# Patient Record
Sex: Male | Born: 1985 | Race: White | Hispanic: No | Marital: Married | Smoking: Current some day smoker
Health system: Southern US, Community
[De-identification: ages and names within clinical notes are randomized; demographics above are authoritative.]

---

## 2003-09-13 ENCOUNTER — Encounter: Payer: Self-pay | Admitting: Emergency Medicine

## 2003-09-13 ENCOUNTER — Emergency Department (HOSPITAL_COMMUNITY): Admission: EM | Admit: 2003-09-13 | Discharge: 2003-09-13 | Payer: Self-pay | Admitting: Emergency Medicine

## 2012-06-27 ENCOUNTER — Ambulatory Visit (INDEPENDENT_AMBULATORY_CARE_PROVIDER_SITE_OTHER): Payer: Commercial Indemnity | Admitting: Physician Assistant

## 2012-06-27 VITALS — BP 139/90 | HR 83 | Temp 98.4°F | Resp 18 | Ht 69.0 in | Wt 292.0 lb

## 2012-06-27 DIAGNOSIS — H902 Conductive hearing loss, unspecified: Secondary | ICD-10-CM

## 2012-06-27 DIAGNOSIS — H6120 Impacted cerumen, unspecified ear: Secondary | ICD-10-CM

## 2012-06-27 NOTE — Progress Notes (Signed)
  Subjective:    Patient ID: James Fry, male    DOB: December 02, 1986, 26 y.o.   MRN: [id number]  HPI Patient presents with possible cerumen impaction. Has noticed decreased hearing over the last week or so, but last night hearing in his left ear became even more noticeably muffled. Denies otalgia, nasal congestion, sore throat, cough, fevers/chills, or headache. No history of cerumen impaction. Does admit to Q-tip use this week to try and and clean out his ears.     Review of Systems  All other systems reviewed and are negative.       Objective:   Physical Exam  Constitutional: He is oriented to person, place, and time. He appears well-developed and well-nourished.  HENT:  Head: Normocephalic and atraumatic.  Right Ear: External ear normal.  Left Ear: External ear normal.  Nose: Nose normal.       Bilateral cerumen impaction.  Normal canal and TM visualized s/p ear irrigation  Eyes: Conjunctivae are normal.  Neck: Normal range of motion.  Cardiovascular: Normal rate, regular rhythm and normal heart sounds.   Pulmonary/Chest: Effort normal and breath sounds normal.  Lymphadenopathy:    He has no cervical adenopathy.  Neurological: He is alert and oriented to person, place, and time.  Psychiatric: He has a normal mood and affect. His behavior is normal. Judgment and thought content normal.          Assessment & Plan:   1. Cerumen impaction  Ear care instructions provided. Follow up as needed.   2. Conductive hearing loss

## 2012-06-27 NOTE — Patient Instructions (Addendum)
Discontinue use of Q-tips, hair pins, keys, etc.   To help prevent cerumen impaction: While in the washing hair in the shower, allow soapy water to run into ear canal for a few minutes and then rinse (it dissolves the wax like a dishwasher dissolves grease).   May also use half hydrogen peroxide half water in the shower 2-3 times per week to help rinse out the wax. DO NOT USE 100% HYDROGEN PEROXIDE (this can burn the skin).   Also, several drops of sweet oil can be used to help prevent itching of the canal. If this is not enough to decrease itch, you may put a small amount of OTC hydrocortisone cream on pinky finger and apply to portion of canal that can be reached with tip of finger.  Recommend OTC Debrox or Colace to prevent impaction.  

## 2018-07-08 ENCOUNTER — Emergency Department (HOSPITAL_COMMUNITY)
Admission: EM | Admit: 2018-07-08 | Discharge: 2018-07-08 | Disposition: A | Discharge status: Home / Self Care | Payer: BLUE CROSS/BLUE SHIELD | Attending: Emergency Medicine | Admitting: Emergency Medicine

## 2018-07-08 ENCOUNTER — Emergency Department (HOSPITAL_COMMUNITY): Payer: BLUE CROSS/BLUE SHIELD

## 2018-07-08 ENCOUNTER — Other Ambulatory Visit: Payer: Self-pay

## 2018-07-08 ENCOUNTER — Encounter (HOSPITAL_COMMUNITY): Payer: Self-pay | Admitting: Emergency Medicine

## 2018-07-08 DIAGNOSIS — R112 Nausea with vomiting, unspecified: Secondary | ICD-10-CM | POA: Diagnosis present

## 2018-07-08 DIAGNOSIS — R1011 Right upper quadrant pain: Secondary | ICD-10-CM | POA: Insufficient documentation

## 2018-07-08 DIAGNOSIS — F17290 Nicotine dependence, other tobacco product, uncomplicated: Secondary | ICD-10-CM | POA: Insufficient documentation

## 2018-07-08 DIAGNOSIS — R109 Unspecified abdominal pain: Secondary | ICD-10-CM

## 2018-07-08 DIAGNOSIS — Z79899 Other long term (current) drug therapy: Secondary | ICD-10-CM | POA: Insufficient documentation

## 2018-07-08 LAB — COMPREHENSIVE METABOLIC PANEL
ALBUMIN: 4.1 g/dL (ref 3.5–5.0)
ALT: 55 U/L — ABNORMAL HIGH (ref 0–44)
AST: 29 U/L (ref 15–41)
Alkaline Phosphatase: 64 U/L (ref 38–126)
Anion gap: 14 (ref 5–15)
BUN: 12 mg/dL (ref 6–20)
CHLORIDE: 107 mmol/L (ref 98–111)
CO2: 22 mmol/L (ref 22–32)
Calcium: 9.7 mg/dL (ref 8.9–10.3)
Creatinine, Ser: 1.26 mg/dL — ABNORMAL HIGH (ref 0.61–1.24)
GFR calc Af Amer: >60 mL/min (ref >60)
GFR calc non Af Amer: >60 mL/min (ref >60)
GLUCOSE: 135 mg/dL — AB (ref 70–99)
POTASSIUM: 3.9 mmol/L (ref 3.5–5.1)
Sodium: 143 mmol/L (ref 135–145)
Total Bilirubin: 0.5 mg/dL (ref 0.3–1.2)
Total Protein: 7.4 g/dL (ref 6.5–8.1)

## 2018-07-08 LAB — I-STAT CG4 LACTIC ACID, ED
Lactic Acid, Venous: 2.27 mmol/L (ref 0.5–1.9)
Lactic Acid, Venous: 2.01 mmol/L (ref 0.5–1.9)

## 2018-07-08 LAB — URINALYSIS, ROUTINE W REFLEX MICROSCOPIC
Bilirubin Urine: NEGATIVE
GLUCOSE, UA: NEGATIVE mg/dL
Hgb urine dipstick: NEGATIVE
KETONES UR: NEGATIVE mg/dL
LEUKOCYTES UA: NEGATIVE
Nitrite: NEGATIVE
PH: 5 (ref 5.0–8.0)
Protein, ur: NEGATIVE mg/dL
Specific Gravity, Urine: 1.028 (ref 1.005–1.030)

## 2018-07-08 LAB — CBC
HEMATOCRIT: 47.3 % (ref 39.0–52.0)
Hemoglobin: 15.6 g/dL (ref 13.0–17.0)
MCH: 28 pg (ref 26.0–34.0)
MCHC: 33 g/dL (ref 30.0–36.0)
MCV: 84.9 fL (ref 78.0–100.0)
Platelets: 278 10*3/uL (ref 150–400)
RBC: 5.57 MIL/uL (ref 4.22–5.81)
RDW: 12.8 % (ref 11.5–15.5)
WBC: 12 10*3/uL — ABNORMAL HIGH (ref 4.0–10.5)

## 2018-07-08 LAB — RAPID URINE DRUG SCREEN, HOSP PERFORMED
AMPHETAMINES: NOT DETECTED
Benzodiazepines: NOT DETECTED
Cocaine: NOT DETECTED
OPIATES: NOT DETECTED
Tetrahydrocannabinol: NOT DETECTED

## 2018-07-08 LAB — LIPASE, BLOOD: LIPASE: 33 U/L (ref 11–51)

## 2018-07-08 LAB — I-STAT TROPONIN, ED: TROPONIN I, POC: 0 ng/mL (ref 0.00–0.08)

## 2018-07-08 MED ORDER — HALOPERIDOL LACTATE 5 MG/ML IJ SOLN: 5.0000 mg | Freq: Once | INTRAMUSCULAR | Status: DC

## 2018-07-08 MED ORDER — ONDANSETRON HCL 4 MG/2ML IJ SOLN
4.0000 mg | Freq: Once | INTRAMUSCULAR | Status: AC
  Administered 2018-07-08: 4 mg via INTRAVENOUS
  Filled 2018-07-08: qty 2

## 2018-07-08 MED ORDER — ONDANSETRON 8 MG PO TBDP: ORAL_TABLET | ORAL | 0 refills | Status: AC

## 2018-07-08 MED ORDER — KETOROLAC TROMETHAMINE 30 MG/ML IJ SOLN
30.0000 mg | Freq: Once | INTRAMUSCULAR | Status: AC
  Administered 2018-07-08: 30 mg via INTRAVENOUS

## 2018-07-08 MED ORDER — KETOROLAC TROMETHAMINE 30 MG/ML IJ SOLN
INTRAMUSCULAR | Status: AC
  Administered 2018-07-08: 30 mg via INTRAVENOUS
  Filled 2018-07-08: qty 1

## 2018-07-08 MED ORDER — DICYCLOMINE HCL 20 MG PO TABS: 20.0000 mg | ORAL_TABLET | Freq: Two times a day (BID) | ORAL | 0 refills | Status: AC

## 2018-07-08 MED ORDER — SODIUM CHLORIDE 0.9 % IV BOLUS
1000.0000 mL | Freq: Once | INTRAVENOUS | Status: AC
  Administered 2018-07-08: 1000 mL via INTRAVENOUS

## 2018-07-08 MED ORDER — FENTANYL CITRATE (PF) 100 MCG/2ML IJ SOLN
100.0000 ug | Freq: Once | INTRAMUSCULAR | Status: AC
  Administered 2018-07-08: 100 ug via INTRAVENOUS
  Filled 2018-07-08: qty 2

## 2018-07-08 MED ORDER — DICYCLOMINE HCL 10 MG/ML IM SOLN
20.0000 mg | Freq: Once | INTRAMUSCULAR | Status: AC
  Administered 2018-07-08: 20 mg via INTRAMUSCULAR
  Filled 2018-07-08: qty 2

## 2018-07-08 MED ORDER — OMEPRAZOLE 20 MG PO CPDR: 20.0000 mg | DELAYED_RELEASE_CAPSULE | Freq: Every day | ORAL | 0 refills | Status: AC

## 2018-07-08 NOTE — ED Notes (Signed)
Pt back from US

## 2018-07-08 NOTE — ED Provider Notes (Signed)
[facility] EMERGENCY DEPARTMENT Provider Note   CSN: [id number] Arrival date & time: 07/08/18  0153     History   Chief Complaint Chief Complaint  Patient presents with  . Abdominal Pain  . Emesis    HPI James Fry is a 32 y.o. male.  The history is provided by the patient. No language interpreter was used.  Abdominal Pain   This is a new problem. The current episode started 3 to 5 hours ago. The problem occurs constantly. The problem has not changed since onset.The pain is associated with an unknown factor. The pain is located in the RUQ and RLQ. The pain is at a severity of 10/10. The pain is severe. Associated symptoms include nausea and vomiting. Pertinent negatives include anorexia, belching, diarrhea, flatus, hematochezia, melena, constipation, dysuria, frequency, hematuria and headaches. Nothing aggravates the symptoms. Nothing relieves the symptoms. Past workup does not include surgery. His past medical history does not include PUD or ulcerative colitis.  Pain starting below the costal margin to the pelvis.  Last ate frozen pizza at 7 pm which has never bothered him before.  This pain started after midnight.  No f/c/r.  Has been retching.  No diarrhea no constipation.    History reviewed. No pertinent past medical history.  There are no active problems to display for this patient.   History reviewed. No pertinent surgical history.      Home Medications    Prior to Admission medications   Medication Sig Start Date End Date Taking? Authorizing Provider  fenofibrate (TRICOR) 145 MG tablet Take 145 mg by mouth daily.   Yes [provider]    Family History No family history on file.  Social History Social History   Tobacco Use  . Smoking status: Current Some Day Smoker    Types: Cigars  . Smokeless tobacco: Never Used  Substance Use Topics  . Alcohol use: Not on file  . Drug use: Not on file     Allergies   Penicillins and  Amoxicillin   Review of Systems Review of Systems  Constitutional: Negative for diaphoresis.  HENT: Negative for congestion.   Respiratory: Negative for cough, choking, chest tightness and shortness of breath.   Cardiovascular: Negative for chest pain, palpitations and leg swelling.  Gastrointestinal: Positive for abdominal pain, nausea and vomiting. Negative for anorexia, constipation, diarrhea, flatus, hematochezia and melena.  Genitourinary: Negative for dysuria, frequency and hematuria.  Musculoskeletal: Negative for back pain.  Neurological: Negative for headaches.  Psychiatric/Behavioral: Negative for agitation.  All other systems reviewed and are negative.    Physical Exam Updated Vital Signs BP (!) 150/85   Pulse 84   Temp 97.6 F (36.4 C) (Oral)   Resp (!) 26   Ht 5\' 8"  (1.727 m)   Wt (!) 142.9 kg (315 lb)   SpO2 (!) 86%   BMI 47.90 kg/m   Physical Exam  Constitutional: He is oriented to person, place, and time. He appears well-developed and well-nourished. No distress.  HENT:  Head: Normocephalic and atraumatic.  Mouth/Throat: No oropharyngeal exudate.  Eyes: Pupils are equal, round, and reactive to light. Conjunctivae are normal.  Neck: Normal range of motion. Neck supple.  Cardiovascular: Normal rate, regular rhythm, normal heart sounds and intact distal pulses.  Pulmonary/Chest: Effort normal and breath sounds normal. No stridor. He has no wheezes. He has no rales.  Abdominal: Soft. He exhibits no mass. Bowel sounds are increased. There is no hepatosplenomegaly. There is no rebound, no  guarding, no tenderness at McBurney's point and negative Murphy's sign. No hernia.  Musculoskeletal: Normal range of motion. He exhibits no tenderness.  Neurological: He is alert and oriented to person, place, and time. He displays normal reflexes.  Skin: Skin is warm and dry. Capillary refill takes less than 2 seconds.  Psychiatric: He has a normal mood and affect.     ED  Treatments / Results  Labs (all labs ordered are listed, but only abnormal results are displayed) Results for orders placed or performed during the hospital encounter of 07/08/18  Lipase, blood  Result Value Ref Range   Lipase 33 11 - 51 U/L  Comprehensive metabolic panel  Result Value Ref Range   Sodium 143 135 - 145 mmol/L   Potassium 3.9 3.5 - 5.1 mmol/L   Chloride 107 98 - 111 mmol/L   CO2 22 22 - 32 mmol/L   Glucose, Bld 135 (H) 70 - 99 mg/dL   BUN 12 6 - 20 mg/dL   Creatinine, Ser 1.61 (H) 0.61 - 1.24 mg/dL   Calcium 9.7 8.9 - 09.6 mg/dL   Total Protein 7.4 6.5 - 8.1 g/dL   Albumin 4.1 3.5 - 5.0 g/dL   AST 29 15 - 41 U/L   ALT 55 (H) 0 - 44 U/L   Alkaline Phosphatase 64 38 - 126 U/L   Total Bilirubin 0.5 0.3 - 1.2 mg/dL   GFR calc non Af Amer >60 >60 mL/min   GFR calc Af Amer >60 >60 mL/min   Anion gap 14 5 - 15  CBC  Result Value Ref Range   WBC 12.0 (H) 4.0 - 10.5 K/uL   RBC 5.57 4.22 - 5.81 MIL/uL   Hemoglobin 15.6 13.0 - 17.0 g/dL   HCT 04.5 40.9 - 81.1 %   MCV 84.9 78.0 - 100.0 fL   MCH 28.0 26.0 - 34.0 pg   MCHC 33.0 30.0 - 36.0 g/dL   RDW 91.4 78.2 - 95.6 %   Platelets 278 150 - 400 K/uL  Urinalysis, Routine w reflex microscopic  Result Value Ref Range   Color, Urine YELLOW YELLOW   APPearance CLEAR CLEAR   Specific Gravity, Urine 1.028 1.005 - 1.030   pH 5.0 5.0 - 8.0   Glucose, UA NEGATIVE NEGATIVE mg/dL   Hgb urine dipstick NEGATIVE NEGATIVE   Bilirubin Urine NEGATIVE NEGATIVE   Ketones, ur NEGATIVE NEGATIVE mg/dL   Protein, ur NEGATIVE NEGATIVE mg/dL   Nitrite NEGATIVE NEGATIVE   Leukocytes, UA NEGATIVE NEGATIVE  Rapid urine drug screen (hospital performed)  Result Value Ref Range   Opiates NONE DETECTED NONE DETECTED   Cocaine NONE DETECTED NONE DETECTED   Benzodiazepines NONE DETECTED NONE DETECTED   Amphetamines NONE DETECTED NONE DETECTED   Tetrahydrocannabinol NONE DETECTED NONE DETECTED   Barbiturates (A) NONE DETECTED    Result not  available. Reagent lot number recalled by manufacturer.  I-Stat CG4 Lactic Acid, ED  Result Value Ref Range   Lactic Acid, Venous 2.27 (HH) 0.5 - 1.9 mmol/L   Comment NOTIFIED PHYSICIAN   I-stat troponin, ED  Result Value Ref Range   Troponin i, poc 0.00 0.00 - 0.08 ng/mL   Comment 3          I-Stat CG4 Lactic Acid, ED  Result Value Ref Range   Lactic Acid, Venous 2.01 (HH) 0.5 - 1.9 mmol/L   Comment NOTIFIED PHYSICIAN    US Abdomen Complete  Result Date: 07/08/2018 CLINICAL DATA:  RIGHT upper quadrant pain for  5 hours. Nausea and vomiting. EXAM: ABDOMEN ULTRASOUND COMPLETE COMPARISON:  CT abdomen and pelvis July 08, 2018 FINDINGS: Habitus limited examination. Gallbladder: Small volume echogenic layering gallbladder sludge. No gallstones or wall thickening visualized. No sonographic Murphy sign noted by sonographer. Common bile duct: Diameter: 4 mm. Liver: No focal lesion identified. Increased parenchymal echogenicity. Portal vein is patent on color Doppler imaging, limited assessment for directionality due to habitus. IVC: No abnormality visualized. Pancreas: Mildly echogenic most compatible with fatty infiltration. Spleen: Size and appearance within normal limits. Right Kidney: Length: 14.6 cm. Echogenicity within normal limits. No mass or hydronephrosis visualized. Left Kidney: Length: 14.8 cm. Echogenicity within normal limits. 8 mm echogenic nonshadowing focus LEFT kidney could reflect vessel. No mass or hydronephrosis visualized. Abdominal aorta: No aneurysm visualized. Other findings: None. IMPRESSION: 1. Habitus limited examination. 2. Gallbladder sludge without sonographic findings of acute cholecystitis. 3. Hepatic steatosis/hepatocellular disease. Electronically Signed   By: Awilda Metro M.D.   On: 07/08/2018 04:58   Ct Renal Stone Study  Result Date: 07/08/2018 CLINICAL DATA:  Right upper quadrant pain radiating to RIGHT flank beginning yesterday. Nausea and vomiting. EXAM: CT  ABDOMEN AND PELVIS WITHOUT CONTRAST TECHNIQUE: Multidetector CT imaging of the abdomen and pelvis was performed following the standard protocol without IV contrast. COMPARISON:  None. FINDINGS: Large body habitus results in overall noisy image quality. LOWER CHEST: Sellar bibasilar atelectasis. The visualized heart size is normal. No pericardial effusion. HEPATOBILIARY: Mild hepatomegaly, diffusely mildly hypodense. Normal gallbladder. PANCREAS: Normal. SPLEEN: Normal. ADRENALS/URINARY TRACT: Kidneys are orthotopic, demonstrating normal size and morphology. No nephrolithiasis, hydronephrosis; limited assessment for renal masses by nonenhanced CT. The unopacified ureters are normal in course and caliber. Urinary bladder is partially distended and unremarkable. Normal adrenal glands. STOMACH/BOWEL: The stomach, small and large bowel are normal in course and caliber without inflammatory changes, sensitivity decreased by lack of enteric contrast. Small air-containing duodenal diverticulum. Normal appendix. VASCULAR/LYMPHATIC: Aortoiliac vessels are normal in course and caliber. No lymphadenopathy by CT size criteria. REPRODUCTIVE: Normal. OTHER: No intraperitoneal free fluid or free air. MUSCULOSKELETAL: Non-acute. Tiny fat containing inguinal and umbilical hernias. Mild lumbar levoscoliosis on this nonweightbearing examination. Scattered Schmorl's nodes. Multilevel mild degenerative discs. IMPRESSION: 1. No nephrolithiasis, hydronephrosis or acute intra-abdominal/pelvic process. 2. Mild hepatomegaly.  Hepatic steatosis. Electronically Signed   By: Awilda Metro M.D.   On: 07/08/2018 04:03    EKG EKG Interpretation  Date/Time:  Wednesday July 08 2018 02:03:51 EDT Ventricular Rate:  96 PR Interval:  158 QRS Duration: 94 QT Interval:  356 QTC Calculation: 449 R Axis:   153 Text Interpretation:  Normal sinus rhythm Incomplete right bundle branch block Possible Right ventricular hypertrophy Confirmed by  Dejanae Helser ([id number]) on 07/08/2018 2:21:01 AM   Radiology US Abdomen Complete  Result Date: 07/08/2018 CLINICAL DATA:  RIGHT upper quadrant pain for 5 hours. Nausea and vomiting. EXAM: ABDOMEN ULTRASOUND COMPLETE COMPARISON:  CT abdomen and pelvis July 08, 2018 FINDINGS: Habitus limited examination. Gallbladder: Small volume echogenic layering gallbladder sludge. No gallstones or wall thickening visualized. No sonographic Murphy sign noted by sonographer. Common bile duct: Diameter: 4 mm. Liver: No focal lesion identified. Increased parenchymal echogenicity. Portal vein is patent on color Doppler imaging, limited assessment for directionality due to habitus. IVC: No abnormality visualized. Pancreas: Mildly echogenic most compatible with fatty infiltration. Spleen: Size and appearance within normal limits. Right Kidney: Length: 14.6 cm. Echogenicity within normal limits. No mass or hydronephrosis visualized. Left Kidney: Length: 14.8 cm. Echogenicity within normal limits. 8  mm echogenic nonshadowing focus LEFT kidney could reflect vessel. No mass or hydronephrosis visualized. Abdominal aorta: No aneurysm visualized. Other findings: None. IMPRESSION: 1. Habitus limited examination. 2. Gallbladder sludge without sonographic findings of acute cholecystitis. 3. Hepatic steatosis/hepatocellular disease. Electronically Signed   By: Awilda Metroourtnay  Bloomer M.D.   On: 07/08/2018 04:58   Ct Renal Stone Study  Result Date: 07/08/2018 CLINICAL DATA:  Right upper quadrant pain radiating to RIGHT flank beginning yesterday. Nausea and vomiting. EXAM: CT ABDOMEN AND PELVIS WITHOUT CONTRAST TECHNIQUE: Multidetector CT imaging of the abdomen and pelvis was performed following the standard protocol without IV contrast. COMPARISON:  None. FINDINGS: Large body habitus results in overall noisy image quality. LOWER CHEST: Sellar bibasilar atelectasis. The visualized heart size is normal. No pericardial effusion. HEPATOBILIARY: Mild  hepatomegaly, diffusely mildly hypodense. Normal gallbladder. PANCREAS: Normal. SPLEEN: Normal. ADRENALS/URINARY TRACT: Kidneys are orthotopic, demonstrating normal size and morphology. No nephrolithiasis, hydronephrosis; limited assessment for renal masses by nonenhanced CT. The unopacified ureters are normal in course and caliber. Urinary bladder is partially distended and unremarkable. Normal adrenal glands. STOMACH/BOWEL: The stomach, small and large bowel are normal in course and caliber without inflammatory changes, sensitivity decreased by lack of enteric contrast. Small air-containing duodenal diverticulum. Normal appendix. VASCULAR/LYMPHATIC: Aortoiliac vessels are normal in course and caliber. No lymphadenopathy by CT size criteria. REPRODUCTIVE: Normal. OTHER: No intraperitoneal free fluid or free air. MUSCULOSKELETAL: Non-acute. Tiny fat containing inguinal and umbilical hernias. Mild lumbar levoscoliosis on this nonweightbearing examination. Scattered Schmorl's nodes. Multilevel mild degenerative discs. IMPRESSION: 1. No nephrolithiasis, hydronephrosis or acute intra-abdominal/pelvic process. 2. Mild hepatomegaly.  Hepatic steatosis. Electronically Signed   By: Awilda Metroourtnay  Bloomer M.D.   On: 07/08/2018 04:03    Procedures Procedures (including critical care time)  Medications Ordered in ED Medications  haloperidol lactate (HALDOL) injection 5 mg (has no administration in time range)  ondansetron (ZOFRAN) injection 4 mg (4 mg Intravenous Given 07/08/18 0241)  sodium chloride 0.9 % bolus 1,000 mL (0 mLs Intravenous Stopped 07/08/18 0401)  fentaNYL (SUBLIMAZE) injection 100 mcg (100 mcg Intravenous Given 07/08/18 0241)  dicyclomine (BENTYL) injection 20 mg (20 mg Intramuscular Given 07/08/18 0241)  fentaNYL (SUBLIMAZE) injection 100 mcg (100 mcg Intravenous Given 07/08/18 0324)  ketorolac (TORADOL) 30 MG/ML injection 30 mg (30 mg Intravenous Given 07/08/18 0500)       Final Clinical  Impressions(s) / ED Diagnoses  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  Will start PPI, bentyl and zofran.  Have advised a bland diet and close follow up  Return for pain, numbness, changes in vision or speech, fevers >100.4 unrelieved by medication, shortness of breath, intractable vomiting, or diarrhea, abdominal pain, Inability to tolerate liquids or food, cough, altered mental status or any concerns. No signs of systemic illness or infection. The patient is nontoxic-appearing on exam and vital signs are within normal limits. Will refer to urology for microscopy hematuria as patient is asymptomatic.  I have reviewed the triage vital signs and the nursing notes. Pertinent labs &imaging results that were available during my care of the patient were reviewed by me and considered in my medical decision making (see chart for details).  After history, exam, and medical workup I feel the patient has been appropriately medically screened and is safe for discharge home. Pertinent diagnoses were discussed with the patient. Patient was given return precautions.   Karna Abed, MD 07/08/18 [phone]

## 2018-07-08 NOTE — ED Notes (Signed)
ED Provider at bedside. 

## 2018-07-08 NOTE — ED Triage Notes (Signed)
Pt reports 8/10 RUQ abd pain that radiates to rt flank that started about 3 hours ago. Pt reports 4 episodes of vomiting. No diarrhea. Pt diaphoretic in triage.

## 2018-07-08 NOTE — ED Notes (Signed)
Patient in US.

## 2019-01-09 IMAGING — CT CT RENAL STONE PROTOCOL
2 of 4 series · 16 of 46 positions shown, 18 images · non-contrast
Comparison: None.

CLINICAL DATA: Right upper quadrant pain radiating to RIGHT flank
beginning yesterday. Nausea and vomiting.

EXAM:
CT ABDOMEN AND PELVIS WITHOUT CONTRAST
TECHNIQUE: Multidetector CT imaging of the abdomen and pelvis was performed
following the standard protocol without IV contrast.

[Series 3: ap without · axial · non-contrast · 0.80mm/px · z∈[+656,+1111]mm · 13 of 101 slices shown, 15 images]
[im 5/101  soft-tissue]
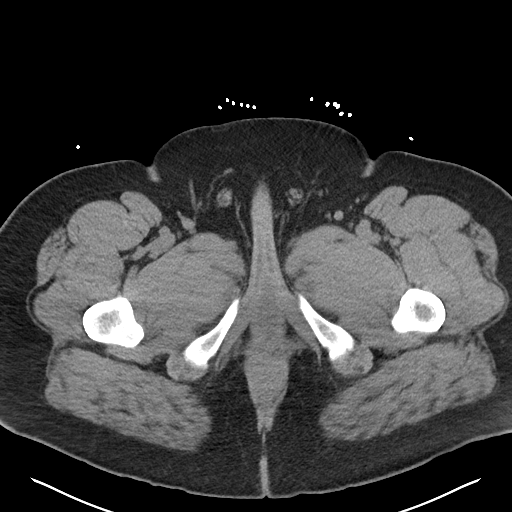
[im 5/101  bone]
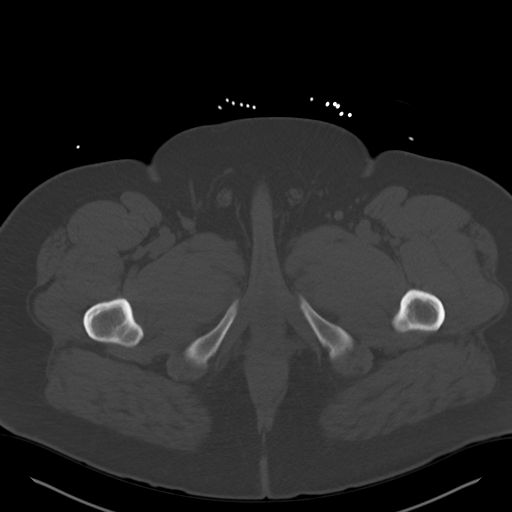
[im 14/101  soft-tissue]
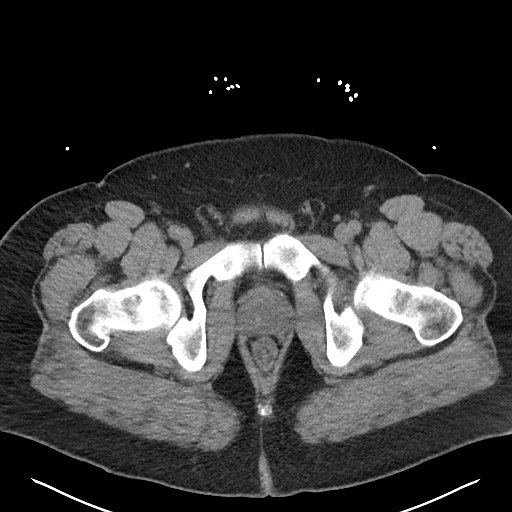
[im 23/101  soft-tissue]
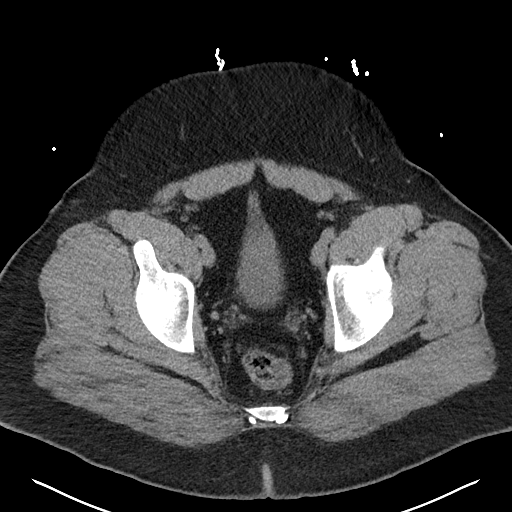
[im 28/101  soft-tissue]
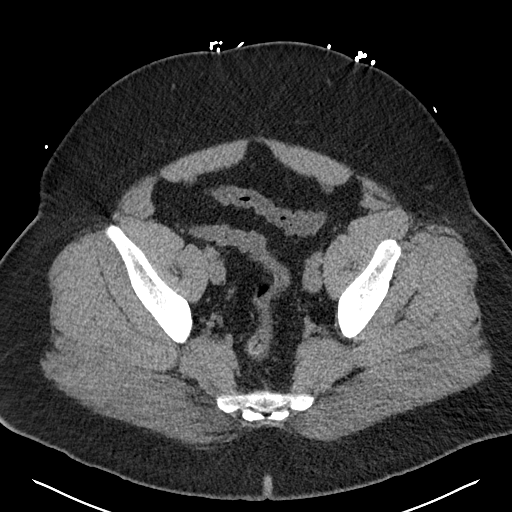
[im 37/101  soft-tissue]
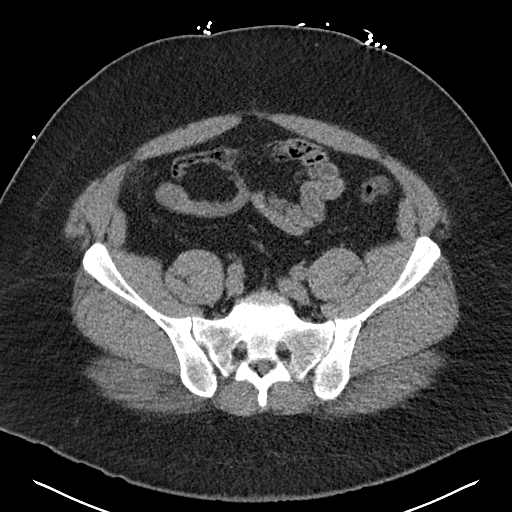
[im 41/101  soft-tissue]
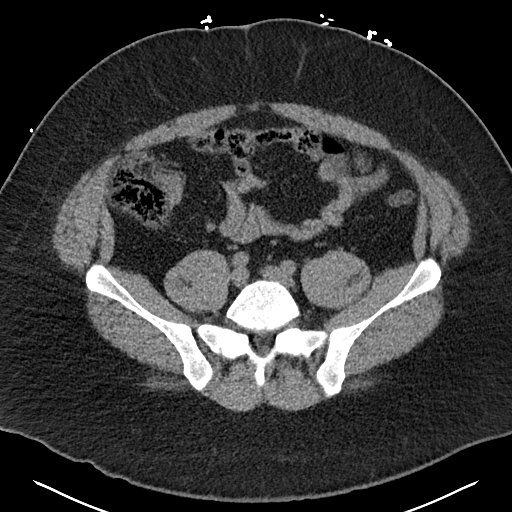
[im 51/101  soft-tissue]
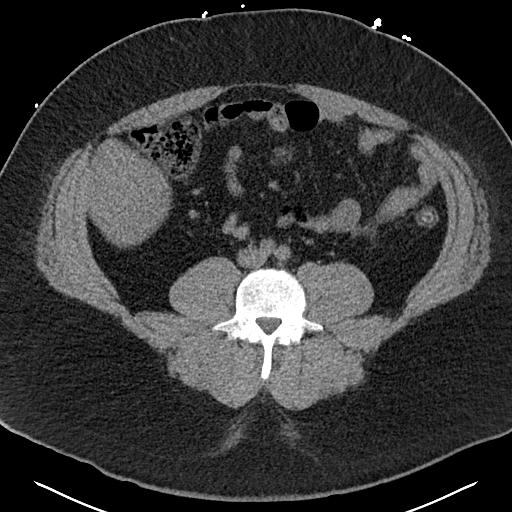
[im 60/101  soft-tissue]
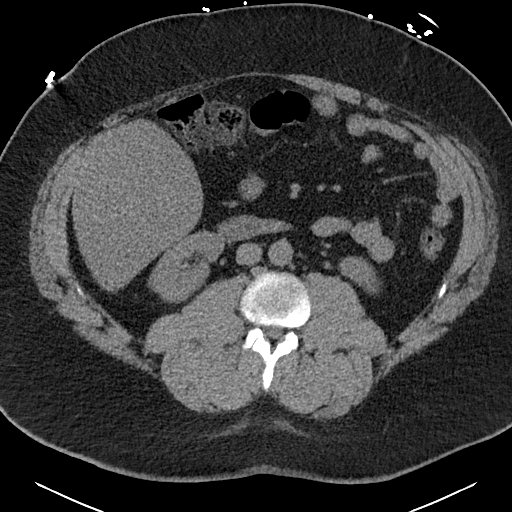
[im 64/101  soft-tissue]
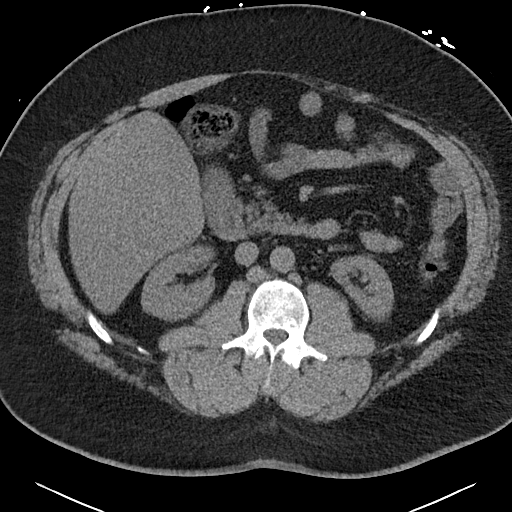
[im 64/101  bone]
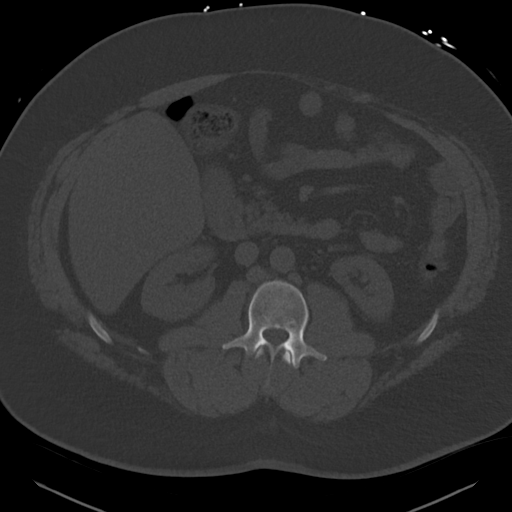
[im 73/101  soft-tissue]
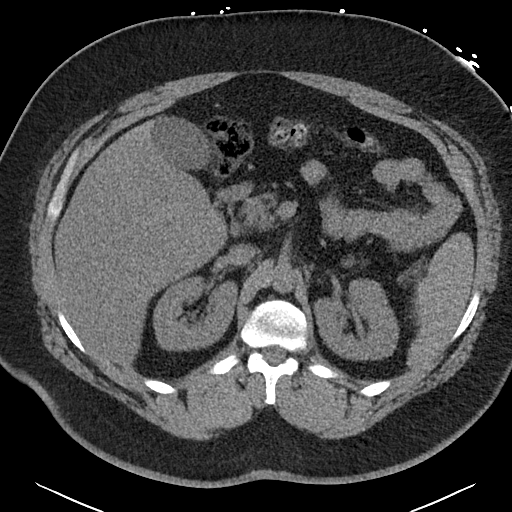
[im 78/101  soft-tissue]
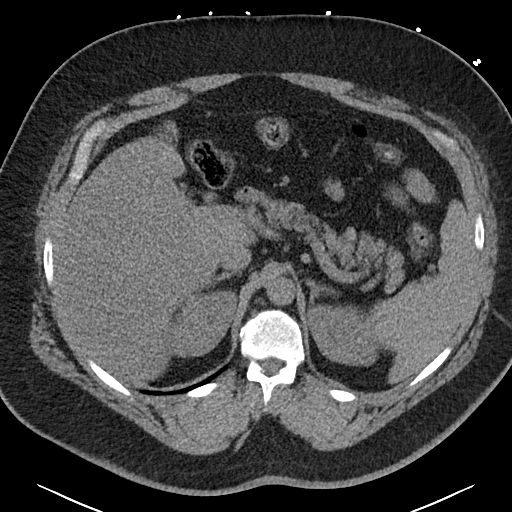
[im 87/101  soft-tissue]
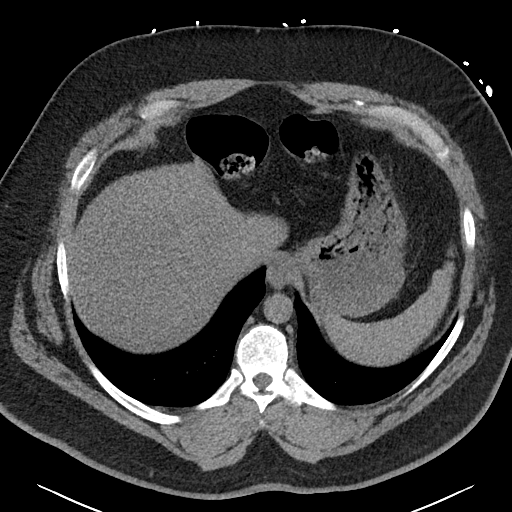
[im 96/101  soft-tissue]
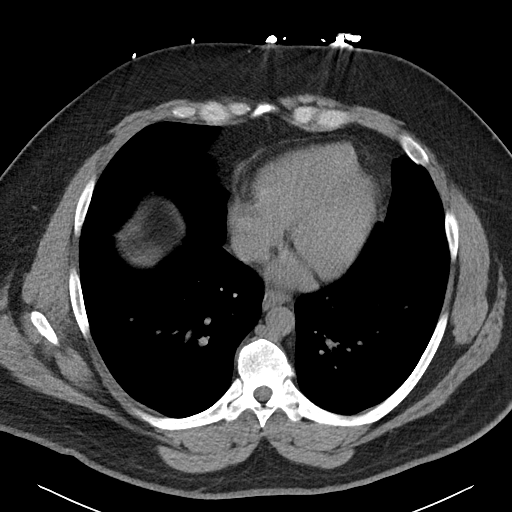

[Series 6: cor · coronal · 0.84mm/px · 3 of 103 slices shown]
[im 35/103  soft-tissue]
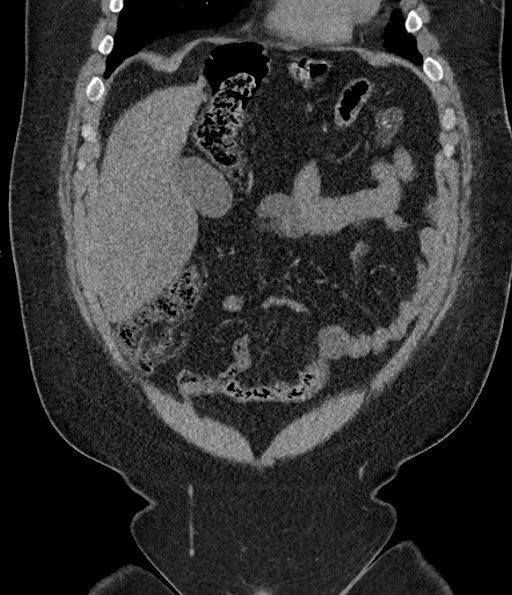
[im 46/103  soft-tissue]
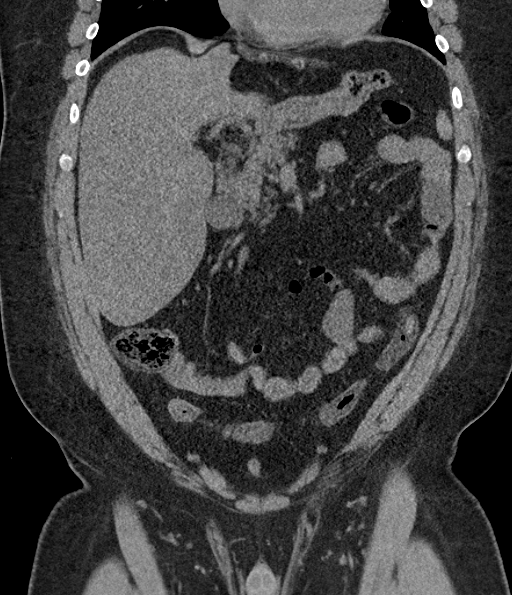
[im 57/103  soft-tissue]
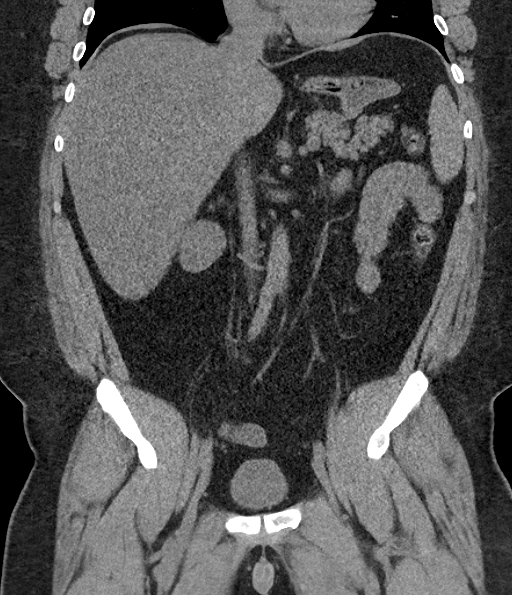

[16 of 46 positions shown; findings below may reference images not displayed]

FINDINGS: Large body habitus results in overall noisy image quality.

LOWER CHEST: Sellar bibasilar atelectasis. The visualized heart size
is normal. No pericardial effusion.

HEPATOBILIARY: Mild hepatomegaly, diffusely mildly hypodense. Normal
gallbladder.

PANCREAS: Normal.

SPLEEN: Normal.

ADRENALS/URINARY TRACT: Kidneys are orthotopic, demonstrating normal
size and morphology. No nephrolithiasis, hydronephrosis; limited
assessment for renal masses by nonenhanced CT. The unopacified
ureters are normal in course and caliber. Urinary bladder is
partially distended and unremarkable. Normal adrenal glands.

STOMACH/BOWEL: The stomach, small and large bowel are normal in
course and caliber without inflammatory changes, sensitivity
decreased by lack of enteric contrast. Small air-containing duodenal
diverticulum. Normal appendix.

VASCULAR/LYMPHATIC: Aortoiliac vessels are normal in course and
caliber. No lymphadenopathy by CT size criteria.

REPRODUCTIVE: Normal.

OTHER: No intraperitoneal free fluid or free air.

MUSCULOSKELETAL: Non-acute. Tiny fat containing inguinal and
umbilical hernias. Mild lumbar levoscoliosis on this
nonweightbearing examination. Scattered Schmorl's nodes. Multilevel
mild degenerative discs.
IMPRESSION: 1. No nephrolithiasis, hydronephrosis or acute
intra-abdominal/pelvic process.
2. Mild hepatomegaly.  Hepatic steatosis.

## 2024-09-05 ENCOUNTER — Emergency Department (HOSPITAL_BASED_OUTPATIENT_CLINIC_OR_DEPARTMENT_OTHER)

## 2024-09-05 ENCOUNTER — Emergency Department (HOSPITAL_BASED_OUTPATIENT_CLINIC_OR_DEPARTMENT_OTHER)
Admission: EM | Admit: 2024-09-05 | Discharge: 2024-09-05 | Disposition: A | Discharge status: Home / Self Care | Attending: Emergency Medicine | Admitting: Emergency Medicine

## 2024-09-05 DIAGNOSIS — Y92410 Unspecified street and highway as the place of occurrence of the external cause: Secondary | ICD-10-CM | POA: Diagnosis not present

## 2024-09-05 DIAGNOSIS — M79641 Pain in right hand: Secondary | ICD-10-CM | POA: Diagnosis present

## 2024-09-05 DIAGNOSIS — S62352A Nondisplaced fracture of shaft of third metacarpal bone, right hand, initial encounter for closed fracture: Secondary | ICD-10-CM | POA: Insufficient documentation

## 2024-09-05 MED ORDER — IBUPROFEN 800 MG PO TABS
800.0000 mg | ORAL_TABLET | Freq: Once | ORAL | Status: AC
  Administered 2024-09-05: 800 mg via ORAL
  Filled 2024-09-05: qty 1

## 2024-09-05 MED ORDER — HYDROCODONE-ACETAMINOPHEN 5-325 MG PO TABS: 1.0000 | ORAL_TABLET | Freq: Four times a day (QID) | ORAL | 0 refills | Status: AC | PRN
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
## 2024-09-07 ENCOUNTER — Ambulatory Visit (INDEPENDENT_AMBULATORY_CARE_PROVIDER_SITE_OTHER): Admitting: Orthopedic Surgery

## 2024-09-07 DIAGNOSIS — S62352A Nondisplaced fracture of shaft of third metacarpal bone, right hand, initial encounter for closed fracture: Secondary | ICD-10-CM | POA: Diagnosis not present
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
## 2024-09-21 ENCOUNTER — Other Ambulatory Visit (INDEPENDENT_AMBULATORY_CARE_PROVIDER_SITE_OTHER): Payer: Self-pay

## 2024-09-21 ENCOUNTER — Ambulatory Visit (INDEPENDENT_AMBULATORY_CARE_PROVIDER_SITE_OTHER): Admitting: Orthopedic Surgery

## 2024-09-21 DIAGNOSIS — S62352A Nondisplaced fracture of shaft of third metacarpal bone, right hand, initial encounter for closed fracture: Secondary | ICD-10-CM | POA: Diagnosis not present

## 2024-09-21 NOTE — Progress Notes (Signed)
    James Fry - 38 y.o. male MRN [id number]  Date of birth: Sep 02, 1986  Office Visit Note: Visit Date: 09/21/2024 PCP: Patient, No Pcp Per Referred by: No ref. provider found  Subjective: No chief complaint on file.  HPI: James Fry is a pleasant 38 y.o. male who returns today for follow-up of a right hand long finger metacarpal fracture being treated nonoperatively with a removable cast.  Is been compliant with casting as instructed.  Pain is controlled, swelling is subsided.  Pertinent ROS were reviewed with the patient and found to be negative unless otherwise specified above in HPI.    Assessment & Plan: Visit Diagnoses:  1. Closed nondisplaced fracture of shaft of third metacarpal bone of right hand, initial encounter     Plan: He continues to do well with nonoperative treatment.  X-rays obtained today show stable appearance of the long finger metacarpal shaft fracture without interval displacement.  Continue with removable casting for additional 3 to 4 weeks.  Follow-up at that time for repeat clinical and radiographic check.  Okay to remove the cast for range of motion exercises.  Remain nonweightbearing..  Follow-up: No follow-ups on file.   Meds & Orders: No orders of the defined types were placed in this encounter.   Orders Placed This Encounter  Procedures   XR Hand Complete Right     Procedures: No procedures performed      Clinical History: No specialty comments available.  He reports that he has been smoking cigars. He has never used smokeless tobacco. No results for input(s): HGBA1C, LABURIC in the last 8760 hours.  Objective:   Vital Signs: There were no vitals taken for this visit.  Physical Exam  Gen: Well-appearing, in no acute distress; non-toxic CV: Regular Rate. Well-perfused. Warm.  Resp: Breathing unlabored on room air; no wheezing. Psych: Fluid speech in conversation; appropriate affect; normal thought process  Ortho Exam Right hand: -  Minimal swelling throughout the dorsal aspect of the hand, no evidence of ecchymotic staining throughout the palmar and dorsal aspect of the hand - Able to perform composite fist without digital overlap, normal cascade to digits - Sensation is intact distally, hand mains warm well-perfused  Imaging: XR Hand Complete Right Result Date: 09/21/2024 X-rays demonstrate stable appearance of the previously known oblique long finger metacarpal shaft fracture without interval displacement   Past Medical/Family/Surgical/Social History: Medications & Allergies reviewed per EMR, new medications updated. There are no active problems to display for this patient.  No past medical history on file. No family history on file. No past surgical history on file. Social History   Occupational History   Not on file  Tobacco Use   Smoking status: Some Days    Types: Cigars   Smokeless tobacco: Never  Vaping Use   Vaping status: Every Day  Substance and Sexual Activity   Alcohol use: Not on file   Drug use: Not on file   Sexual activity: Not on file    James Fry Estela) Arlinda, M.D. [facility], Hand Surgery

## 2024-10-12 ENCOUNTER — Other Ambulatory Visit: Payer: Self-pay

## 2024-10-12 ENCOUNTER — Ambulatory Visit: Admitting: Orthopedic Surgery

## 2024-10-12 DIAGNOSIS — S62352D Nondisplaced fracture of shaft of third metacarpal bone, right hand, subsequent encounter for fracture with routine healing: Secondary | ICD-10-CM

## 2024-10-12 NOTE — Progress Notes (Signed)
    James Fry - 38 y.o. male MRN [id number]  Date of birth: [birth year]  Office Visit Note: Visit Date: 10/12/2024 PCP: Patient, No Pcp Per Referred by: No ref. provider found  Subjective: No chief complaint on file.  HPI: James Fry is a pleasant 38 y.o. male who returns today for 6 week follow-up of a right hand long finger metacarpal fracture being treated nonoperatively with a removable cast.  Is been compliant with casting as instructed.  Pain is controlled, swelling is subsided.  Pertinent ROS were reviewed with the patient and found to be negative unless otherwise specified above in HPI.    Assessment & Plan: Visit Diagnoses:  1. Closed nondisplaced fracture of shaft of third metacarpal bone of right hand with routine healing, subsequent encounter      Plan: He continues to do well with nonoperative treatment.  X-rays obtained today demonstrate appropriate healing of the metacarpal fracture.  His range of motion is excellent as is his grip strength.  At this juncture he can resume activity as tolerated and return to me as needed moving forward.  Follow-up: No follow-ups on file.   Meds & Orders: No orders of the defined types were placed in this encounter.   Orders Placed This Encounter  Procedures   XR Hand Complete Right     Procedures: No procedures performed      Clinical History: No specialty comments available.  He reports that he has been smoking cigars. He has never used smokeless tobacco. No results for input(s): HGBA1C, LABURIC in the last 8760 hours.  Objective:   Vital Signs: There were no vitals taken for this visit.  Physical Exam  Gen: Well-appearing, in no acute distress; non-toxic CV: Regular Rate. Well-perfused. Warm.  Resp: Breathing unlabored on room air; no wheezing. Psych: Fluid speech in conversation; appropriate affect; normal thought process  Ortho Exam Right hand: - No significant swelling throughout the dorsal aspect of the  hand, no evidence of ecchymotic staining throughout the palmar and dorsal aspect of the hand, no tenderness throughout - Able to perform composite fist without digital overlap, normal cascade to digits - Sensation is intact distally, hand mains warm well-perfused - Grip strength Jamar 2 right 80, left 120  Imaging: XR Hand Complete Right Result Date: 10/12/2024 X-rays of the right hand were obtained today which demonstrate appropriate interval healing of the right hand long finger metacarpal shaft fracture, no interval displacement.  There is notable callus formation on multiple views.    Past Medical/Family/Surgical/Social History: Medications & Allergies reviewed per EMR, new medications updated. There are no active problems to display for this patient.  No past medical history on file. No family history on file. No past surgical history on file. Social History   Occupational History   Not on file  Tobacco Use   Smoking status: Some Days    Types: Cigars   Smokeless tobacco: Never  Vaping Use   Vaping status: Every Day  Substance and Sexual Activity   Alcohol use: Not on file   Drug use: Not on file   Sexual activity: Not on file    James Fry, M.D. [facility], Hand Surgery
